# Patient Record
Sex: Female | Born: 1935 | Race: White | Hispanic: No | Smoking: Former smoker
Health system: Southern US, Community
[De-identification: ages and names within clinical notes are randomized; demographics above are authoritative.]

## PROBLEM LIST (undated history)

## (undated) DIAGNOSIS — I10 Essential (primary) hypertension: Secondary | ICD-10-CM

## (undated) DIAGNOSIS — E0590 Thyrotoxicosis, unspecified without thyrotoxic crisis or storm: Secondary | ICD-10-CM

---

## 2021-02-24 ENCOUNTER — Emergency Department (HOSPITAL_BASED_OUTPATIENT_CLINIC_OR_DEPARTMENT_OTHER): Payer: Medicare Other

## 2021-02-24 ENCOUNTER — Other Ambulatory Visit: Payer: Self-pay

## 2021-02-24 ENCOUNTER — Encounter (HOSPITAL_BASED_OUTPATIENT_CLINIC_OR_DEPARTMENT_OTHER): Payer: Self-pay | Admitting: Emergency Medicine

## 2021-02-24 ENCOUNTER — Emergency Department (HOSPITAL_BASED_OUTPATIENT_CLINIC_OR_DEPARTMENT_OTHER)
Admission: EM | Admit: 2021-02-24 | Discharge: 2021-02-24 | Disposition: A | Discharge status: Home / Self Care | Payer: Medicare Other | Attending: Emergency Medicine | Admitting: Emergency Medicine

## 2021-02-24 DIAGNOSIS — Z87891 Personal history of nicotine dependence: Secondary | ICD-10-CM | POA: Insufficient documentation

## 2021-02-24 DIAGNOSIS — I10 Essential (primary) hypertension: Secondary | ICD-10-CM | POA: Diagnosis not present

## 2021-02-24 DIAGNOSIS — R059 Cough, unspecified: Secondary | ICD-10-CM | POA: Diagnosis present

## 2021-02-24 DIAGNOSIS — J069 Acute upper respiratory infection, unspecified: Secondary | ICD-10-CM | POA: Diagnosis not present

## 2021-02-24 DIAGNOSIS — Z20822 Contact with and (suspected) exposure to covid-19: Secondary | ICD-10-CM | POA: Insufficient documentation

## 2021-02-24 HISTORY — DX: Essential (primary) hypertension: I10

## 2021-02-24 HISTORY — DX: Thyrotoxicosis, unspecified without thyrotoxic crisis or storm: E05.90

## 2021-02-24 LAB — RESP PANEL BY RT-PCR (FLU A&B, COVID) ARPGX2
Influenza A by PCR: NEGATIVE
Influenza B by PCR: NEGATIVE
SARS Coronavirus 2 by RT PCR: NEGATIVE

## 2021-02-24 NOTE — ED Provider Notes (Signed)
[facility] EMERGENCY DEPARTMENT Provider Note   CSN: [id number] Arrival date & time: 02/24/21  1229     History Chief Complaint  Patient presents with   Cough    Grace Holmes is a 85 y.o. female.  Patient is a 85 year old female who presents with "head cold".  She has had a 3-day history of some nasal congestion and postnasal drip.  She has had a little bit of a cough.  No shortness of breath.  No fevers.  No nausea or vomiting.  She says she overall feels well but her daughters wanted her checked out and make sure she did not have COVID.      Past Medical History:  Diagnosis Date   Hypertension    Hyperthyroidism     There are no problems to display for this patient.   History reviewed. No pertinent surgical history.   OB History   No obstetric history on file.     No family history on file.  Social History   Tobacco Use   Smoking status: Former    Types: Cigarettes   Smokeless tobacco: Never  Vaping Use   Vaping Use: Never used  Substance Use Topics   Alcohol use: Yes   Drug use: Never    Home Medications Prior to Admission medications   Not on File    Allergies    Patient has no known allergies.  Review of Systems   Review of Systems  Constitutional:  Negative for chills, diaphoresis, fatigue and fever.  HENT:  Positive for congestion, postnasal drip and rhinorrhea. Negative for sneezing.   Eyes: Negative.   Respiratory:  Positive for cough. Negative for chest tightness and shortness of breath.   Cardiovascular:  Negative for chest pain and leg swelling.  Gastrointestinal:  Negative for abdominal pain, blood in stool, diarrhea, nausea and vomiting.  Genitourinary:  Negative for difficulty urinating, flank pain, frequency and hematuria.  Musculoskeletal:  Negative for arthralgias and back pain.  Skin:  Negative for rash.  Neurological:  Negative for dizziness, speech difficulty, weakness, numbness and headaches.   Physical  Exam Updated Vital Signs BP (!) 109/51 (BP Location: Right Arm)   Pulse 73   Temp 98.8 F (37.1 C) (Oral)   Resp 18   Ht 5\' 4"  (1.626 m)   Wt 70.3 kg   SpO2 97%   BMI 26.61 kg/m   Physical Exam Constitutional:      Appearance: She is well-developed.  HENT:     Head: Normocephalic and atraumatic.  Eyes:     Pupils: Pupils are equal, round, and reactive to light.  Cardiovascular:     Rate and Rhythm: Normal rate and regular rhythm.     Heart sounds: Normal heart sounds.  Pulmonary:     Effort: Pulmonary effort is normal. No respiratory distress.     Breath sounds: Normal breath sounds. No wheezing or rales.  Chest:     Chest wall: No tenderness.  Abdominal:     General: Bowel sounds are normal.     Palpations: Abdomen is soft.     Tenderness: There is no abdominal tenderness. There is no guarding or rebound.  Musculoskeletal:        General: Normal range of motion.     Cervical back: Normal range of motion and neck supple.  Lymphadenopathy:     Cervical: No cervical adenopathy.  Skin:    General: Skin is warm and dry.     Findings: No rash.  Neurological:  Mental Status: She is alert and oriented to person, place, and time.    ED Results / Procedures / Treatments   Labs (all labs ordered are listed, but only abnormal results are displayed) Labs Reviewed  RESP PANEL BY RT-PCR (FLU A&B, COVID) [id number]    EKG None  Radiology DG Chest 1 View  Result Date: 02/24/2021 CLINICAL DATA:  Cough EXAM: CHEST  1 VIEW COMPARISON:  10/19/2015 FINDINGS: The lungs are symmetrically well expanded. Eventration of the right hemidiaphragm is unchanged. Opacity at the left lung base represents artifact related to overlying soft tissue. The lungs are otherwise clear. No pneumothorax or pleural effusion. Cardiac size is within normal limits when accounting for changes in patient positioning and radiographic technique. Pulmonary vascularity is normal. No acute bone abnormality.  IMPRESSION: No active disease. Electronically Signed   By: Helyn Numbers MD   On: 02/24/2021 13:43    Procedures Procedures   Medications Ordered in ED Medications - No data to display  ED Course  I have reviewed the triage vital signs and the nursing notes.  Pertinent labs & imaging results that were available during my care of the patient were reviewed by me and considered in my medical decision making (see chart for details).    MDM Rules/Calculators/A&P                           Patient is well-appearing female with URI symptoms.  Chest x-ray is clear without evidence of pneumonia.  Testing was negative for COVID and flu.  She was discharged home in good condition.  Return precautions were given. Final Clinical Impression(s) / ED Diagnoses Final diagnoses:  Viral upper respiratory tract infection    Rx / DC Orders ED Discharge Orders     None        Rolan Bucco, MD 02/24/21 1407

## 2021-02-24 NOTE — ED Triage Notes (Signed)
Pt c/o cough and cold symptoms since Thursday. Pt took home COVID test on Thursday that resulted as negative. 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 Pt did not wear a mask. Pt is fully vaccinated with booster.

## 2022-12-09 IMAGING — DX DG CHEST 1V
1 series · 1 of 1 positions shown · non-contrast
Comparison: 10/19/2015

CLINICAL DATA: Cough

EXAM:
CHEST  1 VIEW

[chest ap]
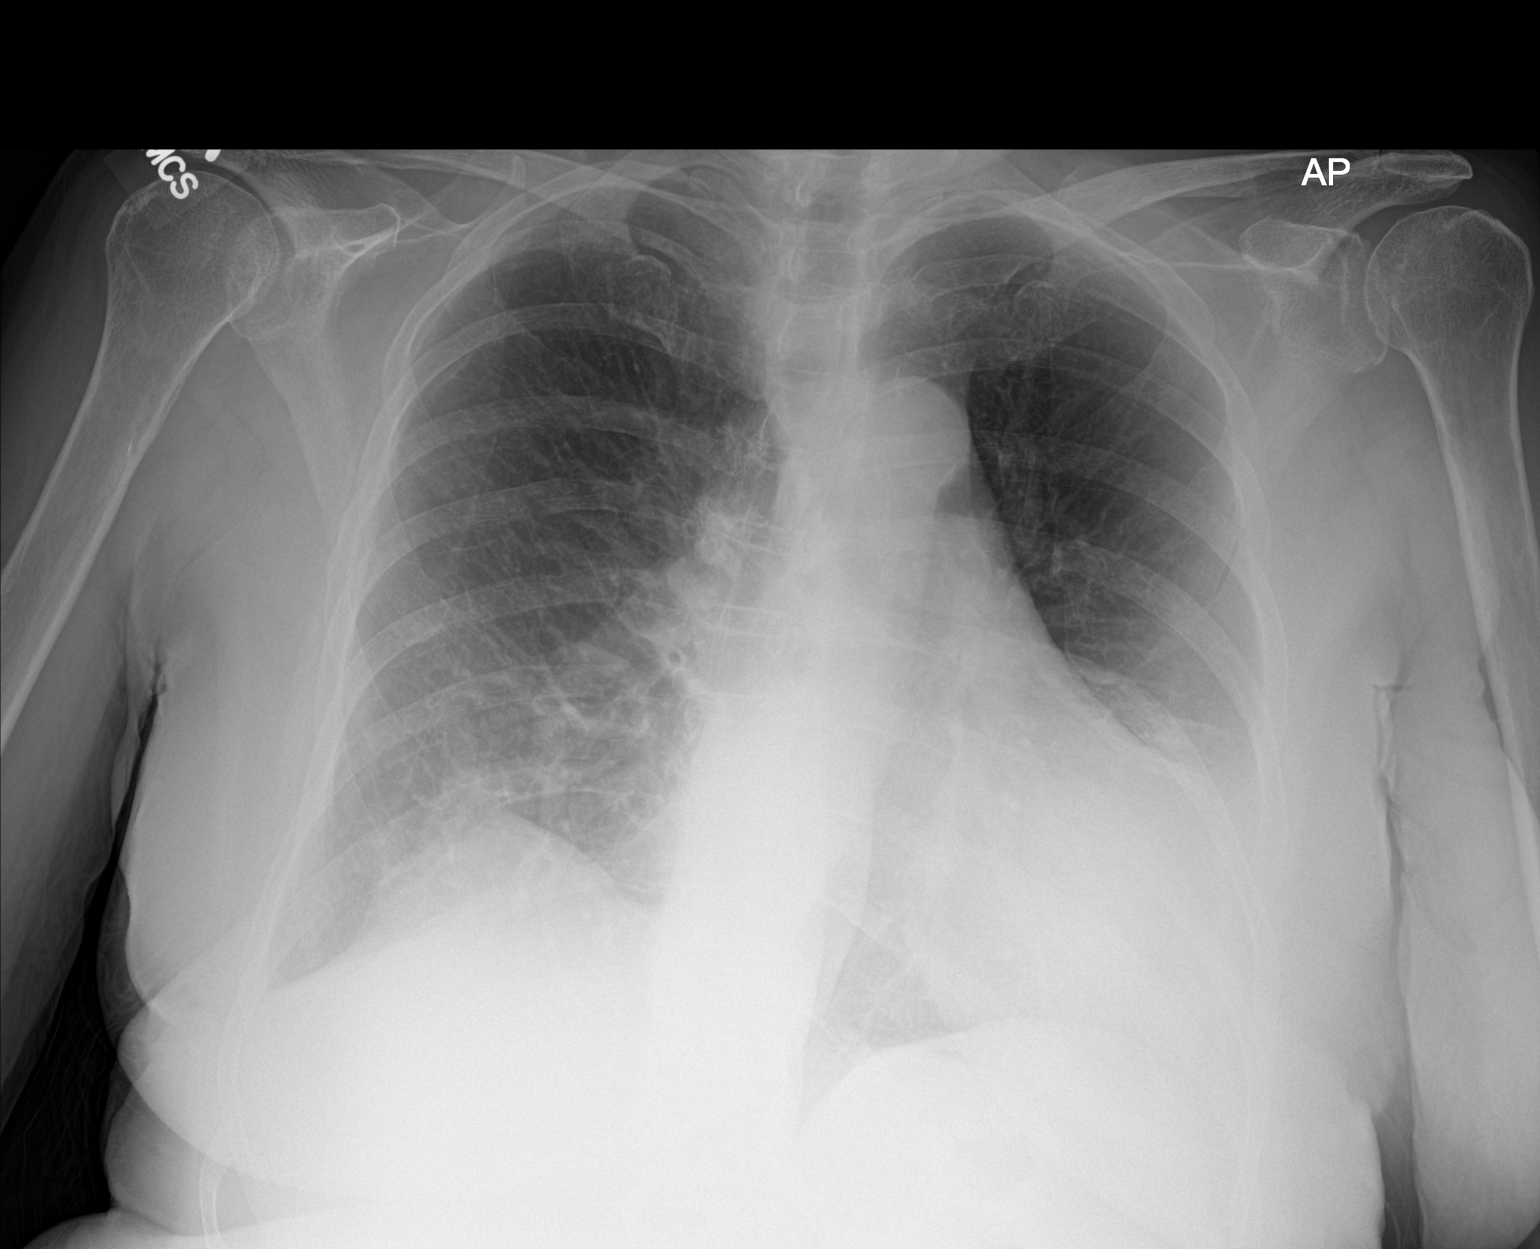

[1 of 1 positions shown; findings below may reference images not displayed]

FINDINGS: The lungs are symmetrically well expanded. Eventration of the right
hemidiaphragm is unchanged. Opacity at the left lung base represents
artifact related to overlying soft tissue. The lungs are otherwise
clear. No pneumothorax or pleural effusion. Cardiac size is within
normal limits when accounting for changes in patient positioning and
radiographic technique. Pulmonary vascularity is normal. No acute
bone abnormality.
IMPRESSION: No active disease.
